# Patient Record
Sex: Female | Born: 2000 | Race: White | Hispanic: No | Marital: Single | Smoking: Never smoker
Health system: Southern US, Community
[De-identification: ages and names within clinical notes are randomized; demographics above are authoritative.]

## PROBLEM LIST (undated history)

## (undated) HISTORY — PX: ADENOIDECTOMY: SUR15

---
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
## 2023-07-07 ENCOUNTER — Emergency Department (HOSPITAL_COMMUNITY)

## 2023-07-07 ENCOUNTER — Emergency Department (HOSPITAL_COMMUNITY)
Admission: EM | Admit: 2023-07-07 | Discharge: 2023-07-07 | Disposition: A | Discharge status: Home / Self Care | Attending: Emergency Medicine | Admitting: Emergency Medicine

## 2023-07-07 ENCOUNTER — Other Ambulatory Visit: Payer: Self-pay

## 2023-07-07 DIAGNOSIS — S81812A Laceration without foreign body, left lower leg, initial encounter: Secondary | ICD-10-CM | POA: Diagnosis not present

## 2023-07-07 DIAGNOSIS — R059 Cough, unspecified: Secondary | ICD-10-CM | POA: Insufficient documentation

## 2023-07-07 DIAGNOSIS — M542 Cervicalgia: Secondary | ICD-10-CM | POA: Insufficient documentation

## 2023-07-07 DIAGNOSIS — R079 Chest pain, unspecified: Secondary | ICD-10-CM | POA: Insufficient documentation

## 2023-07-07 DIAGNOSIS — J029 Acute pharyngitis, unspecified: Secondary | ICD-10-CM | POA: Insufficient documentation

## 2023-07-07 DIAGNOSIS — S20312A Abrasion of left front wall of thorax, initial encounter: Secondary | ICD-10-CM | POA: Insufficient documentation

## 2023-07-07 DIAGNOSIS — S7001XA Contusion of right hip, initial encounter: Secondary | ICD-10-CM | POA: Insufficient documentation

## 2023-07-07 DIAGNOSIS — R490 Dysphonia: Secondary | ICD-10-CM | POA: Insufficient documentation

## 2023-07-07 DIAGNOSIS — Y92410 Unspecified street and highway as the place of occurrence of the external cause: Secondary | ICD-10-CM | POA: Insufficient documentation

## 2023-07-07 DIAGNOSIS — S8992XA Unspecified injury of left lower leg, initial encounter: Secondary | ICD-10-CM | POA: Diagnosis present

## 2023-07-07 DIAGNOSIS — S3991XA Unspecified injury of abdomen, initial encounter: Secondary | ICD-10-CM | POA: Diagnosis not present

## 2023-07-07 DIAGNOSIS — T07XXXA Unspecified multiple injuries, initial encounter: Secondary | ICD-10-CM

## 2023-07-07 LAB — I-STAT CHEM 8, ED
BUN: 16 mg/dL (ref 6–20)
Calcium, Ion: 1.22 mmol/L (ref 1.15–1.40)
Chloride: 103 mmol/L (ref 98–111)
Creatinine, Ser: 0.7 mg/dL (ref 0.44–1.00)
Glucose, Bld: 86 mg/dL (ref 70–99)
HCT: 40 % (ref 36.0–46.0)
Hemoglobin: 13.6 g/dL (ref 12.0–15.0)
Potassium: 4 mmol/L (ref 3.5–5.1)
Sodium: 138 mmol/L (ref 135–145)
TCO2: 24 mmol/L (ref 22–32)

## 2023-07-07 LAB — CBC WITH DIFFERENTIAL/PLATELET
Abs Immature Granulocytes: 0.05 10*3/uL (ref 0.00–0.07)
Basophils Absolute: 0.1 10*3/uL (ref 0.0–0.1)
Basophils Relative: 1 %
Eosinophils Absolute: 0.1 10*3/uL (ref 0.0–0.5)
Eosinophils Relative: 1 %
HCT: 39.2 % (ref 36.0–46.0)
Hemoglobin: 13.1 g/dL (ref 12.0–15.0)
Immature Granulocytes: 0 %
Lymphocytes Relative: 19 %
Lymphs Abs: 2.4 10*3/uL (ref 0.7–4.0)
MCH: 30.1 pg (ref 26.0–34.0)
MCHC: 33.4 g/dL (ref 30.0–36.0)
MCV: 90.1 fL (ref 80.0–100.0)
Monocytes Absolute: 1 10*3/uL (ref 0.1–1.0)
Monocytes Relative: 8 %
Neutro Abs: 8.8 10*3/uL — ABNORMAL HIGH (ref 1.7–7.7)
Neutrophils Relative %: 71 %
Platelets: 265 10*3/uL (ref 150–400)
RBC: 4.35 MIL/uL (ref 3.87–5.11)
RDW: 12.5 % (ref 11.5–15.5)
WBC: 12.4 10*3/uL — ABNORMAL HIGH (ref 4.0–10.5)
nRBC: 0 % (ref 0.0–0.2)

## 2023-07-07 LAB — HCG, QUANTITATIVE, PREGNANCY: hCG, Beta Chain, Quant, S: 1 m[IU]/mL (ref <5)

## 2023-07-07 MED ORDER — IOHEXOL 350 MG/ML SOLN
75.0000 mL | Freq: Once | INTRAVENOUS | Status: AC | PRN
  Administered 2023-07-07: 75 mL via INTRAVENOUS

## 2023-07-07 MED ORDER — CYCLOBENZAPRINE HCL 10 MG PO TABS: 10.0000 mg | ORAL_TABLET | Freq: Two times a day (BID) | ORAL | 0 refills | Status: AC | PRN

## 2023-07-07 MED ORDER — IBUPROFEN 600 MG PO TABS: 600.0000 mg | ORAL_TABLET | Freq: Four times a day (QID) | ORAL | 0 refills | Status: AC | PRN
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
## 2023-07-07 NOTE — ED Notes (Signed)
 Patient verbalizes understanding of discharge instructions. Opportunity for questioning and answers were provided. Pt discharged from ED.

## 2023-07-07 NOTE — ED Notes (Signed)
 Pt requesting to leave AMA. PA notified. Pt willing to wait to speak with provider.
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
